# Patient Record
Sex: Female | Born: 1990 | Race: Black or African American | Hispanic: No | Marital: Single | Smoking: Never smoker
Health system: Southern US, Community
[De-identification: ages and names within clinical notes are randomized; demographics above are authoritative.]

## PROBLEM LIST (undated history)

## (undated) ENCOUNTER — Inpatient Hospital Stay (HOSPITAL_COMMUNITY): Payer: Self-pay

## (undated) DIAGNOSIS — Z789 Other specified health status: Secondary | ICD-10-CM

## (undated) HISTORY — PX: BREAST SURGERY: SHX581

---

## 2014-04-08 ENCOUNTER — Other Ambulatory Visit (HOSPITAL_COMMUNITY)
Admission: RE | Admit: 2014-04-08 | Discharge: 2014-04-08 | Disposition: A | Discharge status: Home / Self Care | Payer: BC Managed Care – PPO | Source: Ambulatory Visit | Attending: Obstetrics & Gynecology | Admitting: Obstetrics & Gynecology

## 2014-04-08 DIAGNOSIS — Z01419 Encounter for gynecological examination (general) (routine) without abnormal findings: Secondary | ICD-10-CM | POA: Insufficient documentation

## 2014-04-15 ENCOUNTER — Emergency Department (HOSPITAL_COMMUNITY)
Admission: EM | Admit: 2014-04-15 | Discharge: 2014-04-16 | Disposition: A | Discharge status: Home / Self Care | Payer: BC Managed Care – PPO | Attending: Emergency Medicine | Admitting: Emergency Medicine

## 2014-04-15 ENCOUNTER — Encounter (HOSPITAL_COMMUNITY): Payer: Self-pay | Admitting: Emergency Medicine

## 2014-04-15 DIAGNOSIS — O210 Mild hyperemesis gravidarum: Secondary | ICD-10-CM | POA: Insufficient documentation

## 2014-04-15 DIAGNOSIS — R1013 Epigastric pain: Secondary | ICD-10-CM | POA: Insufficient documentation

## 2014-04-15 DIAGNOSIS — Z792 Long term (current) use of antibiotics: Secondary | ICD-10-CM | POA: Insufficient documentation

## 2014-04-15 DIAGNOSIS — R197 Diarrhea, unspecified: Secondary | ICD-10-CM | POA: Insufficient documentation

## 2014-04-15 DIAGNOSIS — O9989 Other specified diseases and conditions complicating pregnancy, childbirth and the puerperium: Secondary | ICD-10-CM | POA: Insufficient documentation

## 2014-04-15 DIAGNOSIS — Z3202 Encounter for pregnancy test, result negative: Secondary | ICD-10-CM | POA: Insufficient documentation

## 2014-04-15 DIAGNOSIS — N898 Other specified noninflammatory disorders of vagina: Secondary | ICD-10-CM | POA: Insufficient documentation

## 2014-04-15 DIAGNOSIS — Z3490 Encounter for supervision of normal pregnancy, unspecified, unspecified trimester: Secondary | ICD-10-CM

## 2014-04-15 DIAGNOSIS — R112 Nausea with vomiting, unspecified: Secondary | ICD-10-CM

## 2014-04-15 LAB — CBC WITH DIFFERENTIAL/PLATELET
Basophils Absolute: 0 10*3/uL (ref 0.0–0.1)
Basophils Relative: 0 % (ref 0–1)
Eosinophils Absolute: 0 10*3/uL (ref 0.0–0.7)
Eosinophils Relative: 0 % (ref 0–5)
HEMATOCRIT: 38.7 % (ref 36.0–46.0)
Hemoglobin: 13.5 g/dL (ref 12.0–15.0)
LYMPHS ABS: 1.2 10*3/uL (ref 0.7–4.0)
Lymphocytes Relative: 16 % (ref 12–46)
MCH: 30.5 pg (ref 26.0–34.0)
MCHC: 34.9 g/dL (ref 30.0–36.0)
MCV: 87.6 fL (ref 78.0–100.0)
MONO ABS: 0.3 10*3/uL (ref 0.1–1.0)
MONOS PCT: 5 % (ref 3–12)
NEUTROS ABS: 6 10*3/uL (ref 1.7–7.7)
NEUTROS PCT: 79 % — AB (ref 43–77)
Platelets: 452 10*3/uL — ABNORMAL HIGH (ref 150–400)
RBC: 4.42 MIL/uL (ref 3.87–5.11)
RDW: 14.8 % (ref 11.5–15.5)
WBC: 7.6 10*3/uL (ref 4.0–10.5)

## 2014-04-15 LAB — COMPREHENSIVE METABOLIC PANEL
ALT: 16 U/L (ref 0–35)
AST: 23 U/L (ref 0–37)
Albumin: 4.4 g/dL (ref 3.5–5.2)
Alkaline Phosphatase: 49 U/L (ref 39–117)
BUN: 7 mg/dL (ref 6–23)
CO2: 20 meq/L (ref 19–32)
CREATININE: 0.59 mg/dL (ref 0.50–1.10)
Calcium: 9.7 mg/dL (ref 8.4–10.5)
Chloride: 100 mEq/L (ref 96–112)
GFR calc Af Amer: >90 mL/min (ref >90)
Glucose, Bld: 89 mg/dL (ref 70–99)
Potassium: 4.2 mEq/L (ref 3.7–5.3)
SODIUM: 135 meq/L — AB (ref 137–147)
TOTAL PROTEIN: 8.1 g/dL (ref 6.0–8.3)
Total Bilirubin: 0.3 mg/dL (ref 0.3–1.2)

## 2014-04-15 LAB — WET PREP, GENITAL
Trich, Wet Prep: NONE SEEN
YEAST WET PREP: NONE SEEN

## 2014-04-15 LAB — HCG, QUANTITATIVE, PREGNANCY: HCG, BETA CHAIN, QUANT, S: 54229 m[IU]/mL — AB (ref <5)

## 2014-04-15 LAB — LIPASE, BLOOD: LIPASE: 22 U/L (ref 11–59)

## 2014-04-15 LAB — URINE MICROSCOPIC-ADD ON

## 2014-04-15 LAB — URINALYSIS, ROUTINE W REFLEX MICROSCOPIC
Bilirubin Urine: NEGATIVE
GLUCOSE, UA: NEGATIVE mg/dL
Hgb urine dipstick: NEGATIVE
Ketones, ur: >80 mg/dL — AB
Nitrite: NEGATIVE
PH: 5.5 (ref 5.0–8.0)
Protein, ur: NEGATIVE mg/dL
Specific Gravity, Urine: 1.025 (ref 1.005–1.030)
Urobilinogen, UA: 0.2 mg/dL (ref 0.0–1.0)

## 2014-04-15 LAB — POC URINE PREG, ED: Preg Test, Ur: POSITIVE — AB

## 2014-04-15 LAB — ABO/RH: ABO/RH(D): O POS

## 2014-04-15 MED ORDER — SODIUM CHLORIDE 0.9 % IV BOLUS (SEPSIS)
1000.0000 mL | INTRAVENOUS | Status: AC
  Administered 2014-04-16: 1000 mL via INTRAVENOUS

## 2014-04-15 MED ORDER — ONDANSETRON 4 MG PO TBDP
8.0000 mg | ORAL_TABLET | Freq: Once | ORAL | Status: AC
  Administered 2014-04-15: 8 mg via ORAL
  Filled 2014-04-15: qty 2

## 2014-04-15 MED ORDER — GI COCKTAIL ~~LOC~~
30.0000 mL | Freq: Once | ORAL | Status: AC
  Administered 2014-04-15: 30 mL via ORAL
  Filled 2014-04-15: qty 30

## 2014-04-15 MED ORDER — THIAMINE HCL 100 MG/ML IJ SOLN
100.0000 mg | Freq: Once | INTRAMUSCULAR | Status: AC
  Administered 2014-04-15: 100 mg via INTRAVENOUS
  Filled 2014-04-15: qty 2

## 2014-04-15 MED ORDER — SODIUM CHLORIDE 0.9 % IV BOLUS (SEPSIS)
500.0000 mL | Freq: Once | INTRAVENOUS | Status: AC
  Administered 2014-04-15: 500 mL via INTRAVENOUS

## 2014-04-15 NOTE — ED Notes (Addendum)
Pt reports N/V x 4 days with diarrhea starting today. Reports 5/10 generalized abdominal pain. States she had positive preg test yesterday.  No vaginal discharge, bleeding. LMP 03/01/2014. NAD.

## 2014-04-15 NOTE — ED Provider Notes (Signed)
CSN: [id number]     Arrival date & time 04/15/14  1914 History   First MD Initiated Contact with Patient 04/15/14 2158     Chief Complaint  Patient presents with  . Emesis  . Abdominal Pain     (Consider location/radiation/quality/duration/timing/severity/associated sxs/prior Treatment) The history is provided by the patient and medical records. No language interpreter was used.    Autumn Payne is a 23 y.o. female  G2P0010 with no major medical problems presents to the Emergency Department complaining of gradual, persistent, progressively worsening nausea and vomiting  Onset 3 days ago. Pt reports emesis today at 3:30 and she reports several times each day.  Emesis is stomach contents and NBNB.  Associated symptoms include intermittent epigastric abd cramping which did not begin until today.  Pt denies lower abd or suprapubic abd cramping.   Pt reports she began with diarrhea today (2 loose stools, without melena or hematochezia).  No aggravating or alleviating factors.  Pt denies sick contacts  Pt denies fever, chills, headache, neck pain, chest pain, SOB, weakness, dizziness, syncope, vaginal discharge, vaginal bleeding.  LMP: March 9th.  Pt reports positive home pregnancy test last night.    History reviewed. No pertinent past medical history. Past Surgical History  Procedure Laterality Date  . Breast surgery      cyst removal   History reviewed. No pertinent family history. History  Substance Use Topics  . Smoking status: Never Smoker   . Smokeless tobacco: Not on file  . Alcohol Use: Yes     Comment: social   OB History   Grav Para Term Preterm Abortions TAB SAB Ect Mult Living   1    1     0     Review of Systems  Constitutional: Negative for fever, diaphoresis, appetite change, fatigue and unexpected weight change.  HENT: Negative for mouth sores.   Eyes: Negative for visual disturbance.  Respiratory: Negative for cough, chest tightness, shortness of breath and  wheezing.   Cardiovascular: Negative for chest pain.  Gastrointestinal: Positive for nausea, vomiting ( several episodes each day), abdominal pain (epigastric, soreness) and diarrhea (loose stool only x2). Negative for constipation.  Endocrine: Negative for polydipsia, polyphagia and polyuria.  Genitourinary: Negative for dysuria, urgency, frequency and hematuria.  Musculoskeletal: Negative for back pain and neck stiffness.  Skin: Negative for rash.  Allergic/Immunologic: Negative for immunocompromised state.  Neurological: Negative for syncope, light-headedness and headaches.  Hematological: Does not bruise/bleed easily.  Psychiatric/Behavioral: Negative for sleep disturbance. The patient is not nervous/anxious.       Allergies  Sulfa antibiotics  Home Medications   Prior to Admission medications   Medication Sig Start Date End Date Taking? Authorizing Provider  metroNIDAZOLE (FLAGYL) 500 MG tablet Take 500 mg by mouth 2 (two) times daily. x7 days   Yes Historical Provider, MD   BP 103/48  Pulse 79  Temp(Src) 97.4 F (36.3 C) (Oral)  Resp 18  Ht 5\' 3"  (1.6 m)  Wt 131 lb 8 oz (59.648 kg)  BMI 23.30 kg/m2  SpO2 100%  LMP 03/01/2014 Physical Exam  Nursing note and vitals reviewed. Constitutional: She is oriented to person, place, and time. She appears well-developed and well-nourished. No distress.  Awake, alert, nontoxic appearance  HENT:  Head: Normocephalic and atraumatic.  Mouth/Throat: Oropharynx is clear and moist. No oropharyngeal exudate.  Eyes: Conjunctivae are normal. No scleral icterus.  Neck: Normal range of motion. Neck supple.  Cardiovascular: Normal rate, regular rhythm, normal heart sounds and  intact distal pulses.   No murmur heard. RRR  Pulmonary/Chest: Effort normal and breath sounds normal. No respiratory distress. She has no wheezes.  Clear and equal breath sounds  Abdominal: Soft. Bowel sounds are normal. She exhibits no distension and no mass.  There is no tenderness. There is no rebound and no guarding. Hernia confirmed negative in the right inguinal area and confirmed negative in the left inguinal area.  abd flat, soft and nontender  Genitourinary: Uterus normal. There is no rash, tenderness, lesion or injury on the right labia. There is no rash, tenderness, lesion or injury on the left labia. Uterus is not deviated, not enlarged, not fixed and not tender. Cervix exhibits no motion tenderness, no discharge and no friability. Right adnexum displays no mass, no tenderness and no fullness. Left adnexum displays no mass, no tenderness and no fullness. No erythema, tenderness or bleeding around the vagina. No foreign body around the vagina. No signs of injury around the vagina. Vaginal discharge ( Scant, thick, white ) found.  Pelvic exam with NO tenderness Specifically no lateralizing tenderness, no fullness or tenderness to either adnexa; no palpable masses No cervical motion tenderness Cervical os closed No bleeding from the os or noted in the vaginal vault  Musculoskeletal: Normal range of motion. She exhibits no edema.  Lymphadenopathy:    She has no cervical adenopathy.       Right: No inguinal adenopathy present.       Left: No inguinal adenopathy present.  Neurological: She is alert and oriented to person, place, and time. She exhibits normal muscle tone. Coordination normal.  Speech is clear and goal oriented Moves extremities without ataxia  Skin: Skin is warm and dry. She is not diaphoretic. No erythema.  Psychiatric: She has a normal mood and affect. Her behavior is normal.    ED Course  Procedures (including critical care time) Labs Review Labs Reviewed  WET PREP, GENITAL - Abnormal; Notable for the following:    Clue Cells Wet Prep HPF POC FEW (*)    WBC, Wet Prep HPF POC FEW (*)    All other components within normal limits  COMPREHENSIVE METABOLIC PANEL - Abnormal; Notable for the following:    Sodium 135 (*)     All other components within normal limits  CBC WITH DIFFERENTIAL - Abnormal; Notable for the following:    Platelets 452 (*)    Neutrophils Relative % 79 (*)    All other components within normal limits  URINALYSIS, ROUTINE W REFLEX MICROSCOPIC - Abnormal; Notable for the following:    APPearance CLOUDY (*)    Ketones, ur >80 (*)    Leukocytes, UA SMALL (*)    All other components within normal limits  HCG, QUANTITATIVE, PREGNANCY - Abnormal; Notable for the following:    hCG, Beta Chain, Quant, S [id number] (*)    All other components within normal limits  URINE MICROSCOPIC-ADD ON - Abnormal; Notable for the following:    Squamous Epithelial / LPF MANY (*)    All other components within normal limits  POC URINE PREG, ED - Abnormal; Notable for the following:    Preg Test, Ur POSITIVE (*)    All other components within normal limits  GC/CHLAMYDIA PROBE AMP  LIPASE, BLOOD  ABO/RH    Imaging Review No results found.   EKG Interpretation None      MDM   Final diagnoses:  Nausea and vomiting  Pregnancy   Autumn Payne presents with nausea, intermittent vomiting and  only 2 episodes of loose stools.  Pt endorses epigastric abd pain that began today and is not located in the lower quadrants or suprapubic.  Pt without dysuria, vaginal bleeding.  Abdomen soft and nontender on exam. No rebound, peritoneal signs or guarding.  Patient without any tenderness on pelvic exam. Specifically no lateralizing tenderness. No palpable masses or fullness to either adnexa. Patient without cervical motion tenderness. Cervical os is closed there is no blood in the vaginal vault or coming from the cervical os.  Patient with benign abdomen, no tenderness, no rigidity, no guarding and no other concerning exam findings. Patient's epigastric discomfort likely secondary to her repetitive vomiting. I highly doubt ectopic pregnancy this time.  Patient given GI cocktail and Zofran. She is tolerating by mouth  here in the department without difficulty.  UA without evidence of urinary tract infection but with ketones. Patient given 2 L of fluid. Wet prep without concern for STD. Patient is O+ no need for RhoGAM.  All other labs unremarkable.  Discussed all these findings and my concerns with the patient. She reports that she will call tomorrow for OB followup. I recommend early next week. Patient is to return to the emergency department, specifically women's outpatient emergency department if she develops lower abdominal pain, vaginal bleeding or worsening symptoms.  It has been determined that no acute conditions requiring further emergency intervention are present at this time. The patient/guardian have been advised of the diagnosis and plan. We have discussed signs and symptoms that warrant return to the ED, such as changes or worsening in symptoms.   Vital signs are stable at discharge.   BP 103/48  Pulse 79  Temp(Src) 97.4 F (36.3 C) (Oral)  Resp 18  Ht 5\' 3"  (1.6 m)  Wt 131 lb 8 oz (59.648 kg)  BMI 23.30 kg/m2  SpO2 100%  LMP 03/01/2014  Patient/guardian has voiced understanding and agreed to follow-up with the PCP or specialist.      Dierdre Forth, PA-C 04/16/14 [phone]

## 2014-04-16 MED ORDER — ONDANSETRON 8 MG PO TBDP
ORAL_TABLET | ORAL | Status: DC
Start: 2014-04-16 — End: 2014-06-04

## 2014-04-16 NOTE — ED Provider Notes (Signed)
Medical screening examination/treatment/procedure(s) were performed by non-physician practitioner and as supervising physician I was immediately available for consultation/collaboration.   EKG Interpretation None       Calel Pisarski F Shlome Baldree, MD 04/16/14 1436 

## 2014-04-16 NOTE — Discharge Instructions (Signed)
1. Medications: zofran for vomiting that won't stop, usual home medications 2. Treatment: rest, drink plenty of fluids,  3. Follow Up: Please followup with your OB/GYN for discussion of your diagnoses and further evaluation after today's visit; return to the emergency department immediately if you develop lower abdominal pain, vaginal bleeding or worsening symptoms. In particular [facility] on her discharge instructions.    Cyclic Vomiting Syndrome Cyclic vomiting syndrome is a benign condition in which patients experience bouts or cycles of severe nausea and vomiting that last for hours or even days. The bouts of nausea and vomiting alternate with longer periods of no symptoms and generally good health. Cyclic vomiting syndrome occurs mostly in children, but can affect adults. CAUSES  [facility] has no known cause. Each episode is typically similar to the previous ones. The episodes tend to:   Start at about the same time of day.  Last the same length of time.  Present the same symptoms at the same level of intensity. Cyclic vomiting syndrome can begin at any age in children and adults. Cyclic vomiting syndrome usually starts between the ages of 3 and 7 years. In adults, episodes tend to occur less often than they do in children, but they last longer. Furthermore, the events or situations that trigger episodes in adults cannot always be pinpointed as easily as they can in children. There are 4 phases of cyclic vomiting syndrome: 1. Prodrome. The prodrome phase signals that an episode of nausea and vomiting is about to begin. This phase can last from just a few minutes to several hours. This phase is often marked by belly (abdominal) pain. Sometimes taking medicine early in the prodrome phase can stop an episode in progress. However, sometimes there is no warning. A person may simply wake up in the middle of the night or early morning and  begin vomiting. 2. Episode. The episode phase consists of:  Severe vomiting.  Nausea.  Gagging (retching). 3. Recovery. The recovery phase begins when the nausea and vomiting stop. Healthy color, appetite, and energy return. 4. Symptom-free interval. The symptom-free interval phase is the period between episodes when no symptoms are present. TRIGGERS Episodes can be triggered by an infection or event. Examples of triggers include:  Infections.  Colds, allergies, sinus problems, and the flu.  Eating certain foods such as chocolate or cheese.  Foods with monosodium glutamate (MSG) or preservatives.  Fast foods.  Pre-packaged foods.  Foods with low nutritional value (junk foods).  Overeating.  Eating just before going to bed.  Hot weather.  Dehydration.  Not enough sleep or poor sleep quality.  Physical exhaustion.  Menstruation.  Motion sickness.  Emotional stress (school or home difficulties).  Excitement or stress. SYMPTOMS  The main symptoms of cyclic vomiting syndrome are:  Severe vomiting.  Nausea.  Gagging (retching). Episodes usually begin at night or the first thing in the morning. Episodes may include vomiting or retching up to 5 or 6 times an hour during the worst of the episode. Episodes usually last anywhere from 1 to 4 days. Episodes can last for up to 10 days. Other symptoms include:  Paleness.  Exhaustion.  Listlessness.  Abdominal pain.  Loose stools or diarrhea. Sometimes the nausea and vomiting are so severe that a person appears to be almost unconscious. Sensitivity to light, headache, fever, dizziness, may also accompany an episode. In addition, the vomiting may cause drooling and excessive thirst. Drinking water usually leads  to more vomiting, though the water can dilute the acid in the vomit, making the episode a little less painful. Continuous vomiting can lead to dehydration, which means that the body has lost excessive water and  salts. DIAGNOSIS  Cyclic vomiting syndrome is hard to diagnose because there are no clear tests to identify it. A caregiver must diagnose cyclic vomiting syndrome by looking at symptoms and medical history. A caregiver must exclude more common diseases or disorders that can also cause nausea and vomiting. Also, diagnosis takes time because caregivers need to identify a pattern or cycle to the vomiting. TREATMENT  Cyclic vomiting syndrome cannot be cured. Treatment varies, but people with cyclic vomiting syndrome should get plenty of rest and sleep and take medications that prevent, stop, or lessen the vomiting episodes and other symptoms. People whose episodes are frequent and long-lasting may be treated during the symptom-free intervals in an effort to prevent or ease future episodes. The symptom-free phase is a good time to eliminate anything known to trigger an episode. For example, if episodes are brought on by stress or excitement, this period is the time to find ways to reduce stress and stay calm. If sinus problems or allergies cause episodes, those conditions should be treated. The triggers listed above should be avoided or prevented. Because of the similarities between migraine and cyclic vomiting syndrome, caregivers treat some people with severe cyclic vomiting syndrome with drugs that are also used for migraine headaches. The drugs are designed to:  Prevent episodes.  Reduce their frequency.  Lessen their severity. HOME CARE INSTRUCTIONS Once a vomiting episode begins, treatment is supportive. It helps to stay in bed and sleep in a dark, quiet room. Severe nausea and vomiting may require hospitalization and intravenous (IV) fluids to prevent dehydration. Relaxing medications (sedatives) may help if the nausea continues. Sometimes, during the prodrome phase, it is possible to stop an episode from happening altogether. Only take over-the-counter or prescription medicines for pain, discomfort  or fever as directed by your caregiver. Do not give aspirin to children. During the recovery phase, drinking water and replacing lost electrolytes (salts in the blood) are very important. Electrolytes are salts that the body needs to function well and stay healthy. Symptoms during the recovery phase can vary. Some people find that their appetites return to normal immediately, while others need to begin by drinking clear liquids and then move slowly to solid food. RELATED COMPLICATIONS The severe vomiting that defines cyclic vomiting syndrome is a risk factor for several complications:  Dehydration Vomiting causes the body to lose water quickly.  Electrolyte imbalance Vomiting also causes the body to lose the important salts it needs to keep working properly.  Peptic esophagitis The tube that connects the mouth to the stomach (esophagus) becomes injured from the stomach acid that comes up with the vomit.  Hematemesis The esophagus becomes irritated and bleeds, so blood mixes with the vomit.  Mallory-Weiss tear The lower end of the esophagus may tear open or the stomach may bruise from vomiting or retching.  Tooth decay The acid in the vomit can hurt the teeth by corroding the tooth enamel. SEEK MEDICAL CARE IF: You have questions or problems. Document Released: 02/18/2002 Document Revised: 03/03/2012 Document Reviewed: 03/19/2011 [facility] Patient Information 2014 [place], [place].

## 2014-04-17 LAB — GC/CHLAMYDIA PROBE AMP
CT Probe RNA: NEGATIVE
GC Probe RNA: NEGATIVE

## 2014-05-21 ENCOUNTER — Other Ambulatory Visit (HOSPITAL_COMMUNITY): Payer: Self-pay | Admitting: Nurse Practitioner

## 2014-05-21 DIAGNOSIS — Z3682 Encounter for antenatal screening for nuchal translucency: Secondary | ICD-10-CM

## 2014-06-04 ENCOUNTER — Ambulatory Visit (HOSPITAL_COMMUNITY)
Admission: RE | Admit: 2014-06-04 | Discharge: 2014-06-04 | Disposition: A | Discharge status: Home / Self Care | Payer: BC Managed Care – PPO | Source: Ambulatory Visit | Attending: Nurse Practitioner | Admitting: Nurse Practitioner

## 2014-06-04 ENCOUNTER — Encounter (HOSPITAL_COMMUNITY): Payer: Self-pay

## 2014-06-04 ENCOUNTER — Other Ambulatory Visit (HOSPITAL_COMMUNITY): Payer: Self-pay | Admitting: Nurse Practitioner

## 2014-06-04 ENCOUNTER — Encounter (HOSPITAL_COMMUNITY): Payer: Self-pay | Admitting: *Deleted

## 2014-06-04 ENCOUNTER — Ambulatory Visit (HOSPITAL_COMMUNITY): Admission: RE | Admit: 2014-06-04 | Payer: BC Managed Care – PPO | Source: Ambulatory Visit

## 2014-06-04 ENCOUNTER — Inpatient Hospital Stay (HOSPITAL_COMMUNITY)
Admission: AD | Admit: 2014-06-04 | Discharge: 2014-06-04 | Disposition: A | Discharge status: Home / Self Care | Payer: BC Managed Care – PPO | Source: Ambulatory Visit | Attending: Obstetrics & Gynecology | Admitting: Obstetrics & Gynecology

## 2014-06-04 DIAGNOSIS — Z3682 Encounter for antenatal screening for nuchal translucency: Secondary | ICD-10-CM

## 2014-06-04 DIAGNOSIS — N390 Urinary tract infection, site not specified: Secondary | ICD-10-CM

## 2014-06-04 DIAGNOSIS — R300 Dysuria: Secondary | ICD-10-CM | POA: Diagnosis present

## 2014-06-04 DIAGNOSIS — O2390 Unspecified genitourinary tract infection in pregnancy, unspecified trimester: Secondary | ICD-10-CM | POA: Insufficient documentation

## 2014-06-04 DIAGNOSIS — O2342 Unspecified infection of urinary tract in pregnancy, second trimester: Secondary | ICD-10-CM

## 2014-06-04 DIAGNOSIS — Z36 Encounter for antenatal screening of mother: Secondary | ICD-10-CM | POA: Diagnosis present

## 2014-06-04 HISTORY — DX: Other specified health status: Z78.9

## 2014-06-04 LAB — URINALYSIS, ROUTINE W REFLEX MICROSCOPIC
Bilirubin Urine: NEGATIVE
Glucose, UA: NEGATIVE mg/dL
HGB URINE DIPSTICK: NEGATIVE
Ketones, ur: 15 mg/dL — AB
NITRITE: NEGATIVE
PROTEIN: NEGATIVE mg/dL
Specific Gravity, Urine: 1.015 (ref 1.005–1.030)
Urobilinogen, UA: 0.2 mg/dL (ref 0.0–1.0)
pH: 6.5 (ref 5.0–8.0)

## 2014-06-04 LAB — URINE MICROSCOPIC-ADD ON

## 2014-06-04 MED ORDER — NITROFURANTOIN MONOHYD MACRO 100 MG PO CAPS: 100.0000 mg | ORAL_CAPSULE | Freq: Once | ORAL | Status: DC

## 2014-06-04 NOTE — MAU Note (Addendum)
Pt states she is having a lot of pain with urination and increase in frequency.  Denies vaginal bleeding or abnormal vaginal discharge.  Pain started yesterday and continues to get worse.  Pt was seen in [facility] today.  Had called [facility] and was advised to come to [facility]U after that appt. today

## 2014-06-04 NOTE — MAU Provider Note (Signed)
Attestation of Attending Supervision of Advanced Practitioner (PA/CNM/NP): Evaluation and management procedures were performed by the Advanced Practitioner under my supervision and collaboration.  I have reviewed the Advanced Practitioner's note and chart, and I agree with the management and plan.  Austin Pongratz, MD, FACOG Attending Obstetrician & Gynecologist Faculty Practice, [facility]  

## 2014-06-04 NOTE — Discharge Instructions (Signed)
Urinary Tract Infection A urinary tract infection (UTI) can occur any place along the urinary tract. The tract includes the kidneys, ureters, bladder, and urethra. A type of germ called bacteria often causes a UTI. UTIs are often helped with antibiotic medicine.  HOME CARE   If given, take antibiotics as told by your doctor. Finish them even if you start to feel better.  Drink enough fluids to keep your pee (urine) clear or pale yellow.  Avoid tea, drinks with caffeine, and bubbly (carbonated) drinks.  Pee often. Avoid holding your pee in for a long time.  Pee before and after having sex (intercourse).  Wipe from front to back after you poop (bowel movement) if you are a woman. Use each tissue only once. GET HELP RIGHT AWAY IF:   You have back pain.  You have lower belly (abdominal) pain.  You have chills.  You feel sick to your stomach (nauseous).  You throw up (vomit).  Your burning or discomfort with peeing does not go away.  You have a fever.  Your symptoms are not better in 3 days. MAKE SURE YOU:   Understand these instructions.  Will watch your condition.  Will get help right away if you are not doing well or get worse. Document Released: 05/28/2008 Document Revised: 09/03/2012 Document Reviewed: 07/10/2012 [facility]Exit[facility] Patient Information 2014 [place]ExitCare, [place].  Strongly encouraged patient to drink 6-8 glass of water a day to stay well hydrated and prevent UTI

## 2014-06-04 NOTE — MAU Provider Note (Signed)
History     CSN: [id number]  Arrival date and time: 06/04/14 1412   First Provider Initiated Contact with Patient 06/04/14 1449    Chief complaint: Pain with urination   HPI Autumn Payne is a 23 y/o G2P0010 black female presents to the MAU for pain with urination since yesterday. Pt endorses increased frequency, urgency, and burning with urination but denies hematuria and flank pain. Pt does not have fevers, chills, N/V. Pt is 1777w4d pregnant dated by LMP jon 03/01/14. Pt states that she has had a UTI in the past and that these symptoms feel exactly the same. Pt denies vaginal bleeding, itching and odor. Pt also endorsed two dizzy spells last week but admitted to drinking only 1/2 bottle of water, daily. Dizzy spells have resolved.   [facility]   Past Medical History  Diagnosis Date  . Medical history non-contributory     Past Surgical History  Procedure Laterality Date  . Breast surgery      cyst removal    History reviewed. No pertinent family history.  History  Substance Use Topics  . Smoking status: Never Smoker   . Smokeless tobacco: Not on file  . Alcohol Use: Yes     Comment: social    Allergies:  Allergies  Allergen Reactions  . Sulfa Antibiotics Hives    Prescriptions prior to admission  Medication Sig Dispense Refill  . Loratadine (CLARITIN PO) Take by mouth.      . metroNIDAZOLE (FLAGYL) 500 MG tablet Take 500 mg by mouth 2 (two) times daily. x7 days      . ondansetron (ZOFRAN ODT) 8 MG disintegrating tablet 8mg  ODT q4 hours prn nausea  10 tablet  0  . Prenatal Multivit-Min-Fe-FA (PRENATAL VITAMINS PO) Take by mouth.        Review of Systems  Constitutional: Negative for fever and chills.  Eyes: Negative for blurred vision.  Respiratory: Negative for shortness of breath.   Cardiovascular: Negative for chest pain.  Gastrointestinal: Negative for nausea, vomiting and abdominal pain.  Genitourinary: Positive for  dysuria, urgency and frequency. Negative for hematuria and flank pain.  Musculoskeletal: Negative for back pain.  Neurological: Positive for dizziness (two dizzy spells last week). Negative for headaches.   Physical Exam   Blood pressure 110/62, pulse 78, temperature 98.6 F (37 C), temperature source Oral, resp. rate 18, height 5' 1.5" (1.562 m), weight 58.06 kg (128 lb), last menstrual period 03/01/2014, SpO2 100.00%.  Physical Exam  Constitutional: She is oriented to person, place, and time. She appears well-developed and well-nourished. No distress.  Cardiovascular: Normal rate, regular rhythm and normal heart sounds.   Respiratory: Effort normal and breath sounds normal.  GI: Soft. Bowel sounds are normal. There is no tenderness. There is no rebound, no guarding and no CVA tenderness.  Genitourinary: Uterus is enlarged (Fundus palpated just above pubic symphysis ). Uterus is not tender.  Neurological: She is alert and oriented to person, place, and time.  Psychiatric: She has a normal mood and affect. Her behavior is normal.   Fetal HR by Doppler: 154 bpm   Results for orders placed during the hospital encounter of 06/04/14 (from the past 24 hour(s))  URINALYSIS, ROUTINE W REFLEX MICROSCOPIC     Status: Abnormal   Collection Time    06/04/14  2:30 PM      Result Value Ref Range   Color, Urine YELLOW  YELLOW   APPearance CLEAR  CLEAR   Specific Gravity, Urine  1.015  1.005 - 1.030   pH 6.5  5.0 - 8.0   Glucose, UA NEGATIVE  NEGATIVE mg/dL   Hgb urine dipstick NEGATIVE  NEGATIVE   Bilirubin Urine NEGATIVE  NEGATIVE   Ketones, ur 15 (*) NEGATIVE mg/dL   Protein, ur NEGATIVE  NEGATIVE mg/dL   Urobilinogen, UA 0.2  0.0 - 1.0 mg/dL   Nitrite NEGATIVE  NEGATIVE   Leukocytes, UA MODERATE (*) NEGATIVE  URINE MICROSCOPIC-ADD ON     Status: Abnormal   Collection Time    06/04/14  2:30 PM      Result Value Ref Range   Squamous Epithelial / LPF FEW (*) RARE   WBC, UA 11-20  <3  WBC/hpf   Bacteria, UA RARE  RARE    MAU Course  Procedures  Urine culture pending  None  Assessment and Plan  A:  Acute UTI      13w 4d gestation  P:  Treat with Macrobid 1 tab po bid X 1 week--take all medication Educate patient about the infection, causes, and preventive measures Advise the patient to increase fluid intake for proper hydration  Bing PlumeGervasi, Kristin E 06/04/2014, 3:03 PM   I have reviewed the student's HPI, labs and plan of care and agree with findings Rx did not go through EPIC.  I called the Rx for Macrobid 1 po bid X 1week #14 [facility] [place]

## 2014-06-04 NOTE — Progress Notes (Signed)
[facility] ultrasound  Indication: 23 yr old G2P0010 at 1642w4d for first trimester screen.  Findings: 1. Single intrauterine pregnancy. 2. Fetal crown rump length is consistent with dating. 3. Normal uterus; no adnexal masses seen. 4. Evaluation of fetal anatomy is limited by early gestational age. 5. Nuchal translucency could not be obtained secondary to fetal position. 6. The nasal bone is indeterminate.   Recommendations: 1. Appropriate dating. 2. Could not obtain nuchal translucency: - offered patient reattempt or quad screen - opted for reattempt and will return 06/07/14 - discussed limitations of screening tests in detecting fetal aneuploidy 3. Recommend maternal serum AFP at 15-20 weeks 4. Recommend fetal anatomic survey at 18-20 weeks  Eulis FosterKristen Ovella Manygoats, MD

## 2014-06-07 ENCOUNTER — Encounter (HOSPITAL_COMMUNITY): Payer: Self-pay

## 2014-06-07 ENCOUNTER — Ambulatory Visit (HOSPITAL_COMMUNITY)
Admission: RE | Admit: 2014-06-07 | Discharge: 2014-06-07 | Disposition: A | Discharge status: Home / Self Care | Payer: BC Managed Care – PPO | Source: Ambulatory Visit | Attending: Nurse Practitioner | Admitting: Nurse Practitioner

## 2014-06-07 ENCOUNTER — Other Ambulatory Visit (HOSPITAL_COMMUNITY): Payer: Self-pay | Admitting: Nurse Practitioner

## 2014-06-07 DIAGNOSIS — Z36 Encounter for antenatal screening of mother: Secondary | ICD-10-CM | POA: Diagnosis present

## 2014-06-07 DIAGNOSIS — Z3682 Encounter for antenatal screening for nuchal translucency: Secondary | ICD-10-CM

## 2014-06-08 LAB — URINE CULTURE: SPECIAL REQUESTS: NORMAL

## 2014-06-09 ENCOUNTER — Telehealth: Payer: Self-pay | Admitting: Medical

## 2014-06-09 DIAGNOSIS — N390 Urinary tract infection, site not specified: Secondary | ICD-10-CM

## 2014-06-09 MED ORDER — CEPHALEXIN 500 MG PO CAPS: 500.0000 mg | ORAL_CAPSULE | Freq: Four times a day (QID) | ORAL | Status: DC

## 2014-06-09 NOTE — Telephone Encounter (Signed)
LM for patient to return call to MAU. Patient had urine culture resistant to Macrobid. Rx sent for Keflex. Patient needs to be notified to stop Macrobid and take Keflex instead.   Freddi StarrJulie N Ethier, PA-C 06/09/2014 9:55 AM

## 2014-06-09 NOTE — Telephone Encounter (Signed)
Patient returned call to MAU. Informed of change in antibiotic therapy. Patient voiced understanding.   Freddi StarrJulie N Ethier, PA-C 06/09/2014 11:12 AM

## 2014-06-14 ENCOUNTER — Other Ambulatory Visit: Payer: Self-pay

## 2014-06-23 ENCOUNTER — Other Ambulatory Visit (HOSPITAL_COMMUNITY): Payer: Self-pay | Admitting: Family

## 2014-06-23 DIAGNOSIS — Z3689 Encounter for other specified antenatal screening: Secondary | ICD-10-CM

## 2014-07-20 ENCOUNTER — Inpatient Hospital Stay (HOSPITAL_COMMUNITY)
Admission: AD | Admit: 2014-07-20 | Discharge: 2014-07-20 | Disposition: A | Discharge status: Home / Self Care | Payer: BC Managed Care – PPO | Source: Ambulatory Visit | Attending: Obstetrics & Gynecology | Admitting: Obstetrics & Gynecology

## 2014-07-20 ENCOUNTER — Ambulatory Visit (HOSPITAL_COMMUNITY)
Admission: RE | Admit: 2014-07-20 | Discharge: 2014-07-20 | Disposition: A | Discharge status: Home / Self Care | Payer: BC Managed Care – PPO | Source: Ambulatory Visit | Attending: Family | Admitting: Family

## 2014-07-20 ENCOUNTER — Encounter (HOSPITAL_COMMUNITY): Payer: Self-pay | Admitting: *Deleted

## 2014-07-20 DIAGNOSIS — O2390 Unspecified genitourinary tract infection in pregnancy, unspecified trimester: Secondary | ICD-10-CM | POA: Diagnosis not present

## 2014-07-20 DIAGNOSIS — Z3689 Encounter for other specified antenatal screening: Secondary | ICD-10-CM | POA: Insufficient documentation

## 2014-07-20 DIAGNOSIS — B9689 Other specified bacterial agents as the cause of diseases classified elsewhere: Secondary | ICD-10-CM

## 2014-07-20 DIAGNOSIS — N760 Acute vaginitis: Secondary | ICD-10-CM

## 2014-07-20 DIAGNOSIS — N898 Other specified noninflammatory disorders of vagina: Secondary | ICD-10-CM | POA: Diagnosis present

## 2014-07-20 DIAGNOSIS — A499 Bacterial infection, unspecified: Secondary | ICD-10-CM | POA: Diagnosis not present

## 2014-07-20 LAB — WET PREP, GENITAL
Trich, Wet Prep: NONE SEEN
Yeast Wet Prep HPF POC: NONE SEEN

## 2014-07-20 MED ORDER — METRONIDAZOLE 500 MG PO TABS: 500.0000 mg | ORAL_TABLET | Freq: Two times a day (BID) | ORAL | Status: AC

## 2014-07-20 NOTE — MAU Provider Note (Signed)
None     Chief Complaint:  Vaginal Discharge   Autumn Payne is  23 y.o. G2P0010 at 3886w1d presents complaining of Vaginal Discharge .  She states none contractions are associated with none vaginal bleeding, intact membranes, along with active fetal movement. Pt. With hx of bacterial vaginosis several weeks ago. S/p treatment with flagyl. She says that her symptoms have returned, including vaginal itching. She denies fever, chills, VB, LOF. She has +FM. She has no other complaints, but would like to be treated again.   Obstetrical/Gynecological History: OB History   Grav Para Term Preterm Abortions TAB SAB Ect Mult Living   2    1     0     Past Medical History: Past Medical History  Diagnosis Date  . Medical history non-contributory     Past Surgical History: Past Surgical History  Procedure Laterality Date  . Breast surgery      cyst removal    Family History: Family History  Problem Relation Age of Onset  . Hypertension Mother   . Hypertension Maternal Grandmother     Social History: History  Substance Use Topics  . Smoking status: Never Smoker   . Smokeless tobacco: Not on file  . Alcohol Use: Yes     Comment: social - not with pregnancy    Allergies:  Allergies  Allergen Reactions  . Sulfa Antibiotics Hives    Meds:  Prescriptions prior to admission  Medication Sig Dispense Refill  . loratadine (CLARITIN) 10 MG tablet Take 10 mg by mouth daily.      . Prenatal Vit-Fe Fumarate-FA (PRENATAL MULTIVITAMIN) TABS tablet Take 1 tablet by mouth daily at 12 noon.        Review of Systems -  Per HPI      Physical Exam  Blood pressure 102/66, pulse 82, temperature 99.2 F (37.3 C), temperature source Oral, resp. rate 16, height 5\' 2"  (1.575 m), weight 60.782 kg (134 lb), last menstrual period 03/01/2014, SpO2 100.00%. GENERAL: Well-developed, well-nourished female in no acute distress.  LUNGS: Clear to auscultation bilaterally.  HEART: Regular rate  and rhythm. ABDOMEN: Soft, nontender, nondistended, gravid.  EXTREMITIES: Nontender, no edema, 2+ distal pulses. Grossly neurologically intact CERVICAL EXAM: Deferred  FHT 150 by doppler  Labs: Results for orders placed during the hospital encounter of 07/20/14 (from the past 24 hour(s))  WET PREP, GENITAL   Collection Time    07/20/14  3:00 PM      Result Value Ref Range   Yeast Wet Prep HPF POC NONE SEEN  NONE SEEN   Trich, Wet Prep NONE SEEN  NONE SEEN   Clue Cells Wet Prep HPF POC FEW (*) NONE SEEN   WBC, Wet Prep HPF POC FEW (*) NONE SEEN     Assessment: Autumn Payne is  23 y.o. G2P0010 at 1286w1d presents with Vaginal discharge  Plan: - Likely recurrence of bacterial vaginosis - Lifestyle modifications reviewed.  - Flagyl 500mg  BID x 7 days.  - Follow up PRN.   Melancon, Hillery HunterCaleb G 7/28/20154:20 PM  I have seen this patient and agree with the above resident's note.  LEFTWICH-KIRBY, Luvia Orzechowski Certified Nurse-Midwife

## 2014-07-20 NOTE — Discharge Instructions (Signed)
Bacterial Vaginosis Bacterial vaginosis is a vaginal infection that occurs when the normal balance of bacteria in the vagina is disrupted. It results from an overgrowth of certain bacteria. This is the most common vaginal infection in women of childbearing age. Treatment is important to prevent complications, especially in pregnant women, as it can cause a premature delivery. CAUSES  Bacterial vaginosis is caused by an increase in harmful bacteria that are normally present in smaller amounts in the vagina. Several different kinds of bacteria can cause bacterial vaginosis. However, the reason that the condition develops is not fully understood. RISK FACTORS Certain activities or behaviors can put you at an increased risk of developing bacterial vaginosis, including:  Having a new sex partner or multiple sex partners.  Douching.  Using an intrauterine device (IUD) for contraception. Women do not get bacterial vaginosis from toilet seats, bedding, swimming pools, or contact with objects around them. SIGNS AND SYMPTOMS  Some women with bacterial vaginosis have no signs or symptoms. Common symptoms include:  Grey vaginal discharge.  A fishlike odor with discharge, especially after sexual intercourse.  Itching or burning of the vagina and vulva.  Burning or pain with urination. DIAGNOSIS  Your health care provider will take a medical history and examine the vagina for signs of bacterial vaginosis. A sample of vaginal fluid may be taken. Your health care provider will look at this sample under a microscope to check for bacteria and abnormal cells. A vaginal pH test may also be done.  TREATMENT  Bacterial vaginosis may be treated with antibiotic medicines. These may be given in the form of a pill or a vaginal cream. A second round of antibiotics may be prescribed if the condition comes back after treatment.  HOME CARE INSTRUCTIONS   Only take over-the-counter or prescription medicines as  directed by your health care provider.  If antibiotic medicine was prescribed, take it as directed. Make sure you finish it even if you start to feel better.  Do not have sex until treatment is completed.  Tell all sexual partners that you have a vaginal infection. They should see their health care provider and be treated if they have problems, such as a mild rash or itching.  Practice safe sex by using condoms and only having one sex partner. SEEK MEDICAL CARE IF:   Your symptoms are not improving after 3 days of treatment.  You have increased discharge or pain.  You have a fever. MAKE SURE YOU:   Understand these instructions.  Will watch your condition.  Will get help right away if you are not doing well or get worse. FOR MORE INFORMATION  Centers for Disease Control and Prevention, Division of STD Prevention: www.cdc.gov/std American Sexual Health Association (ASHA): www.ashastd.org  Document Released: 12/10/2005 Document Revised: 09/30/2013 Document Reviewed: 07/22/2013 Exit[employer] Patient Information 2015 [employer]. This information is not intended to replace advice given to you by your health care provider. Make sure you discuss any questions you have with your health care provider.  

## 2014-07-20 NOTE — MAU Note (Signed)
Pt had scheduled appt in u/s for anatomy scan today. Will go to u/s for appt and then return to mau for lab results and f/u plan

## 2014-07-20 NOTE — MAU Note (Signed)
Urine in lab 

## 2014-07-20 NOTE — Progress Notes (Signed)
Dr Arnell SievingMelanchon on unit and aware of pt's admission and status.

## 2014-07-20 NOTE — MAU Note (Signed)
Patient states that she was recently treated for BV and yeast. Took all the medication. Feels like the infections is back, has a vaginal odor with yellowish discharge. Denies pain, bleeding, leaking or abdominal pain. Has felt fetal movement.

## 2014-07-20 NOTE — MAU Note (Signed)
Pt back from her scheduled u/s. Sharen CounterLisa Leftwich-Kirby CNM in to discuss wet prep results with pt and d/c plan

## 2014-07-21 LAB — GC/CHLAMYDIA PROBE AMP
CT PROBE, AMP APTIMA: NEGATIVE
GC Probe RNA: NEGATIVE

## 2014-07-27 NOTE — MAU Provider Note (Signed)
Attestation of Attending Supervision of Advanced Practitioner (CNM/NP): Evaluation and management procedures were performed by the Advanced Practitioner under my supervision and collaboration. I have reviewed the Advanced Practitioner's note and chart, and I agree with the management and plan.  Janson Lamar H. 1:32 PM   

## 2014-10-25 ENCOUNTER — Encounter (HOSPITAL_COMMUNITY): Payer: Self-pay | Admitting: *Deleted

## 2015-04-09 ENCOUNTER — Encounter (HOSPITAL_COMMUNITY): Payer: Self-pay | Admitting: *Deleted

## 2016-04-28 IMAGING — US US MFM FETAL NUCHAL TRANSLUCENCY
1 series · 13 of 22 positions shown · non-contrast
Comparison: none

[Series 1: us mfm fetal nuchal translucency · 0.09mm/px · 22 acquisitions, 13 frames shown]
[im 1/22]
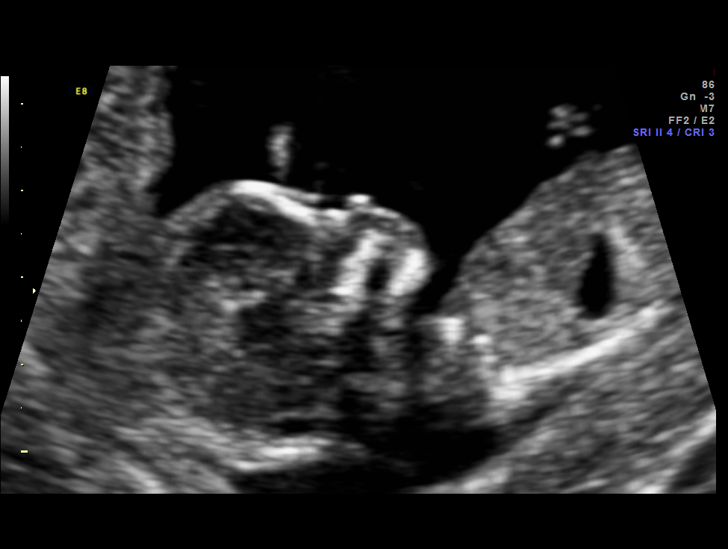
[im 3/22]
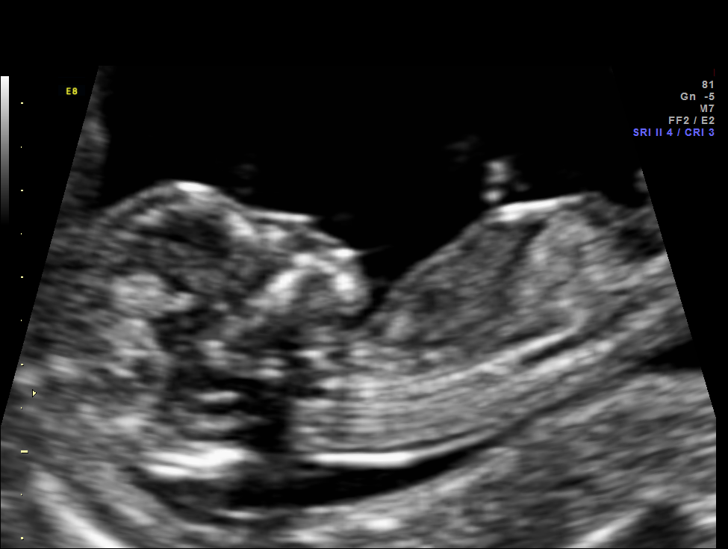
[im 5/22]
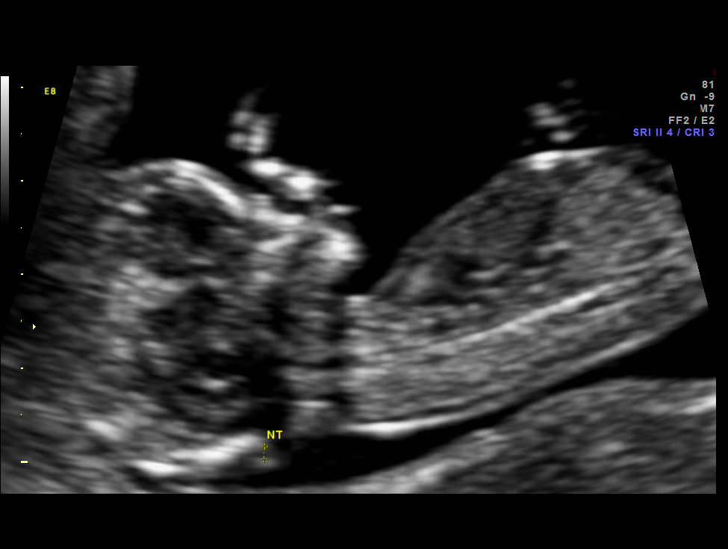
[im 6/22]
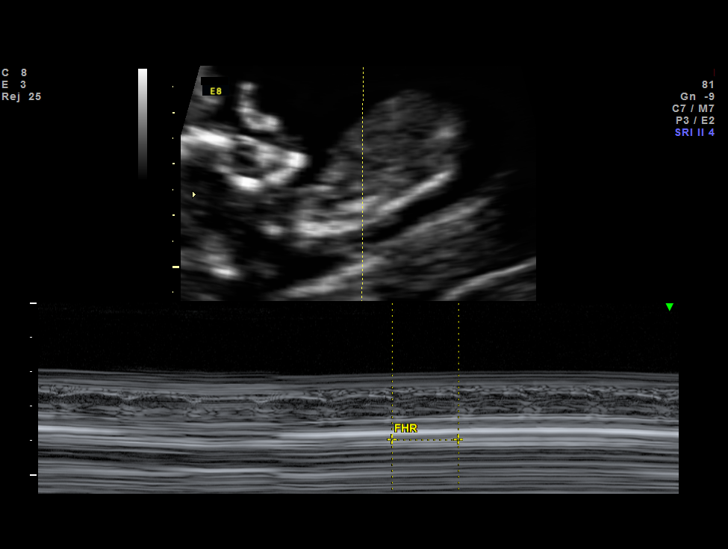
[im 8/22]
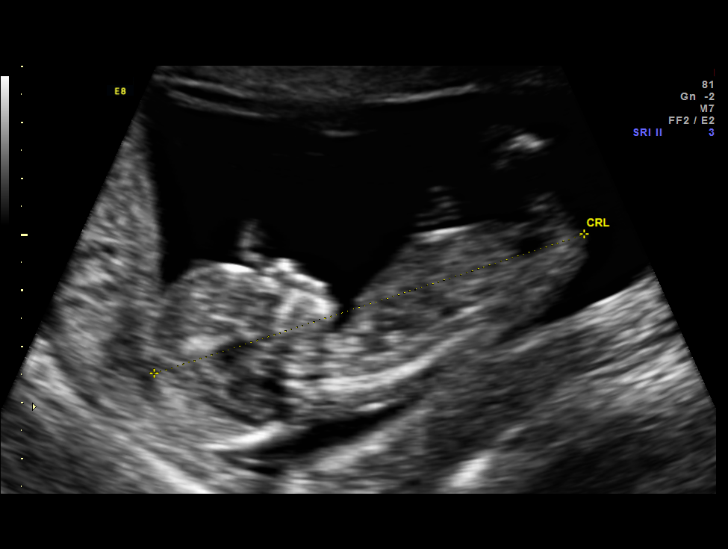
[im 10/22]
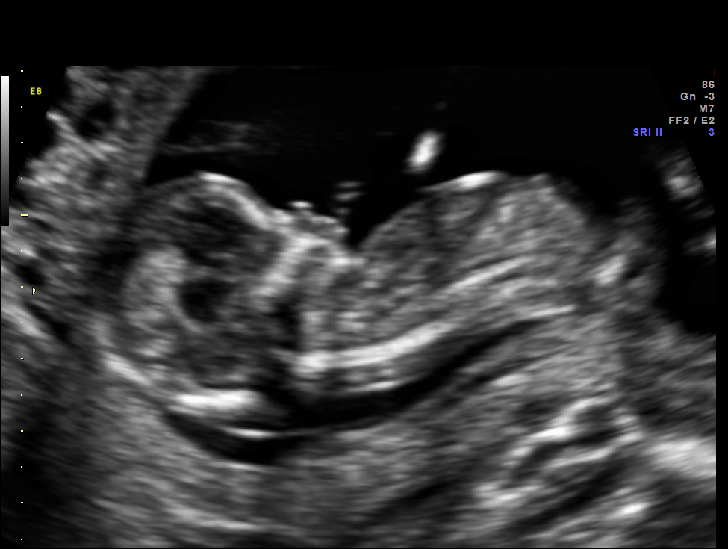
[im 12/22]
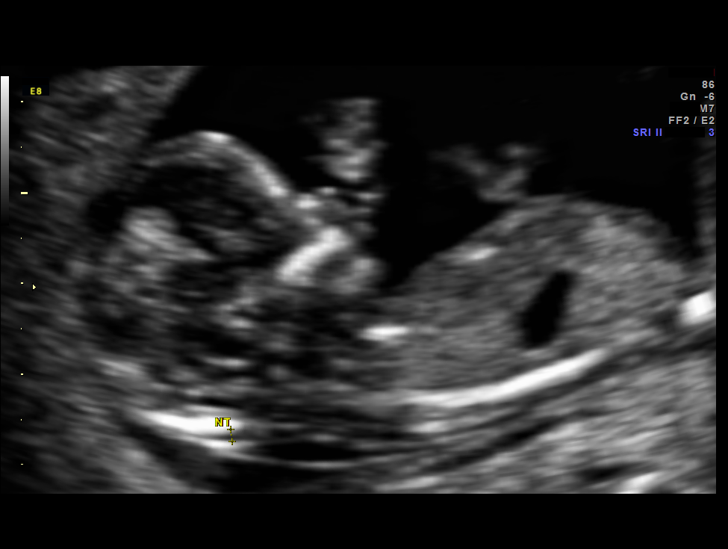
[im 13/22]
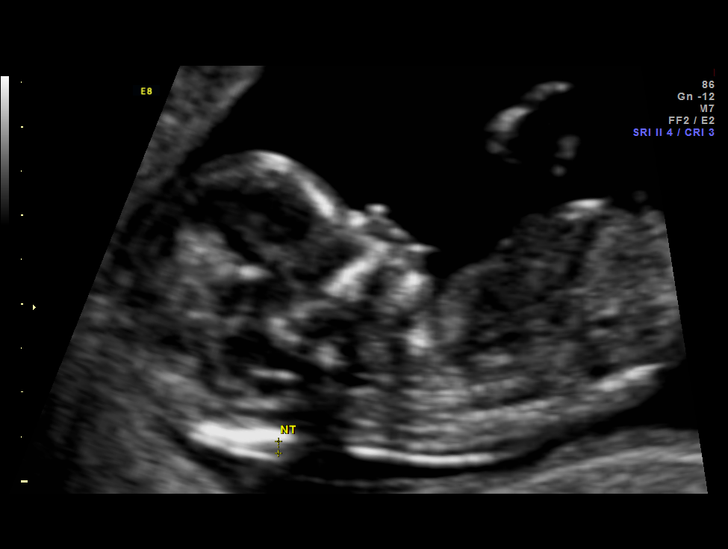
[im 15/22]
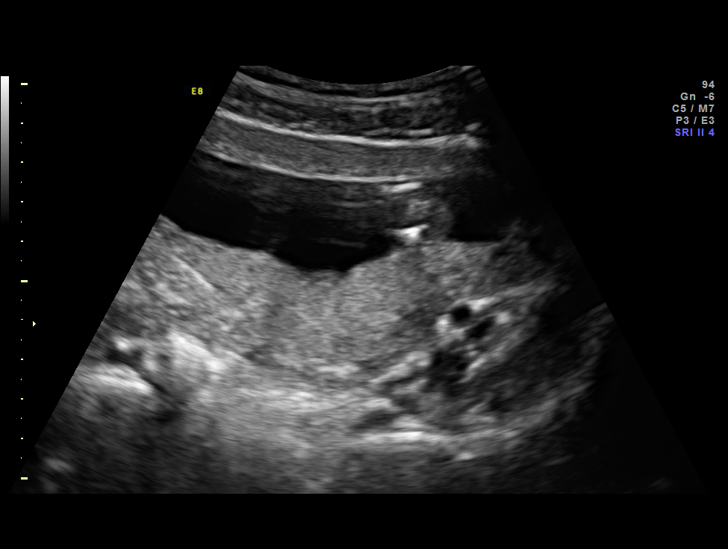
[im 17/22]
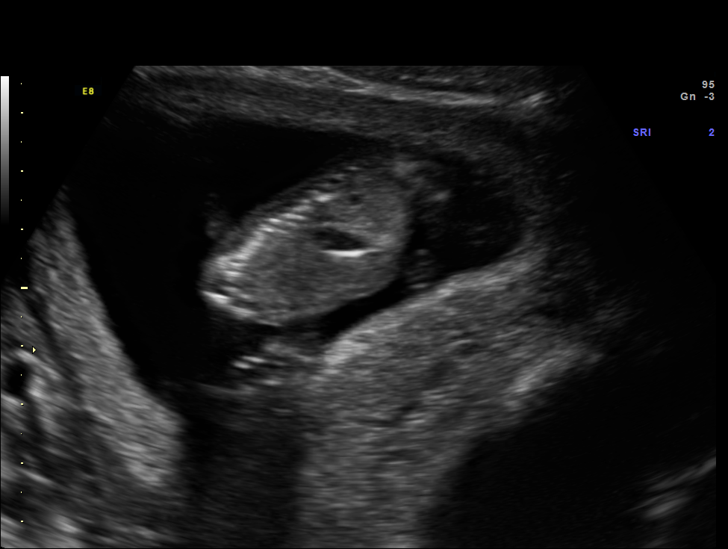
[im 18/22]
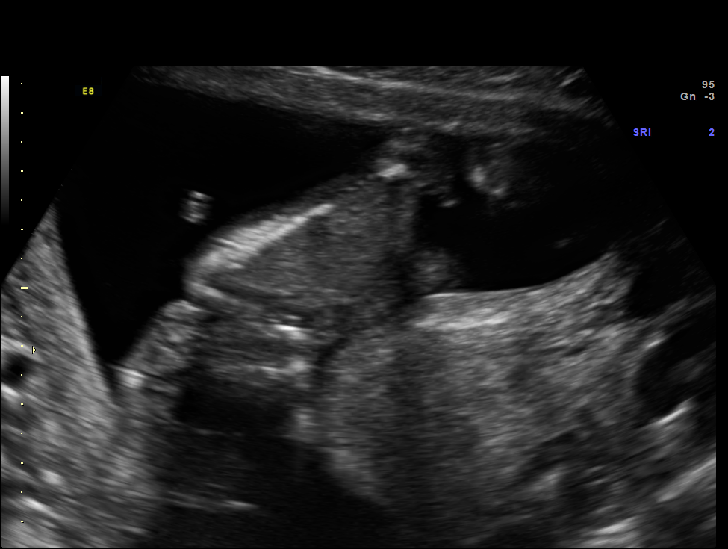
[im 20/22]
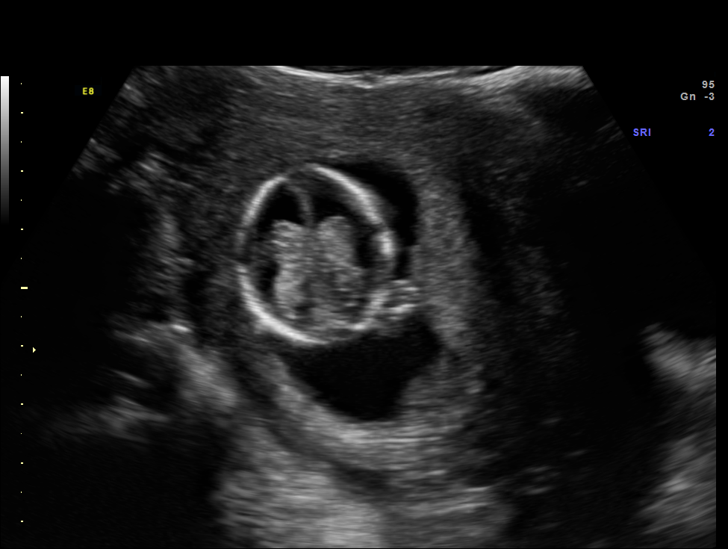
[im 22/22]
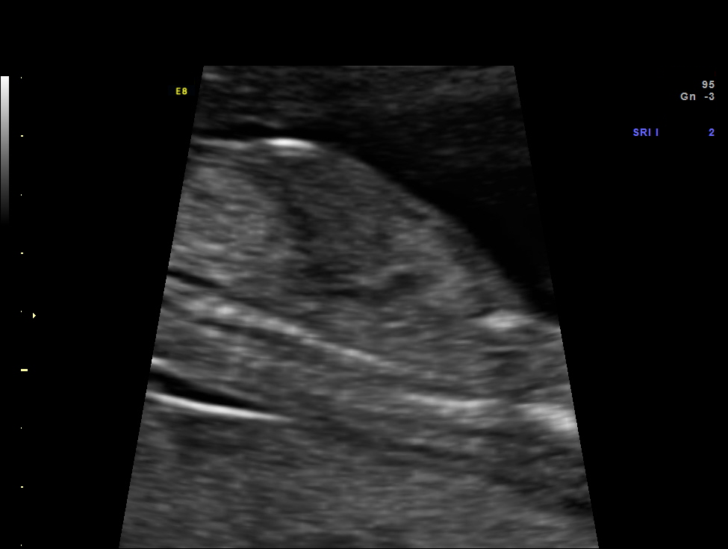

[13 of 22 positions shown; findings below may reference images not displayed]

OBSTETRICS REPORT
                      (Signed Final 06/07/2014 [DATE])

Service(s) Provided

Indications

 First trimester aneuploidy screen (NT)
Fetal Evaluation

 Num Of Fetuses:    1
 Fetal Heart Rate:  152                          bpm
 Cardiac Activity:  Observed
 Presentation:      Transverse, head to
                    maternal right
 Placenta:          Posterior

 Amniotic Fluid
 AFI FV:      Subjectively within normal limits
Gestational Age

 LMP:           14w 0d        Date:  03/01/14                 EDD:   12/06/14
 Best:          14w 0d     Det. By:  LMP  (03/01/14)          EDD:   12/06/14
1st Trimester Genetic Sonogram Screening

 CRL:            80.9  mm    G. Age:   13w 5d                 EDD:   12/08/14
 Nuc Trans:       1.5  mm

 Nasal Bone:                 Present
Cervix Uterus Adnexa

 Cervix:       Normal appearance by transabdominal scan.
Impression

 SIUP at 14+0 weeks
 No gross abnormalities identified
 NT measurement was within normal limits for this GA; NB
 present
 Normal amniotic fluid volume
 Measurements consistent with LMP dating
Recommendations

 Offer MSAFP in the second trimester for ONTD screening
 Offer anatomy U/S by 18 weeks

## 2016-06-10 IMAGING — US US OB COMP +14 WK
1 series · 12 of 28 positions shown · non-contrast
Comparison: none

[Series 1: us ob comp +14 wk · 12 of 118 slices shown]
[im 5/118]
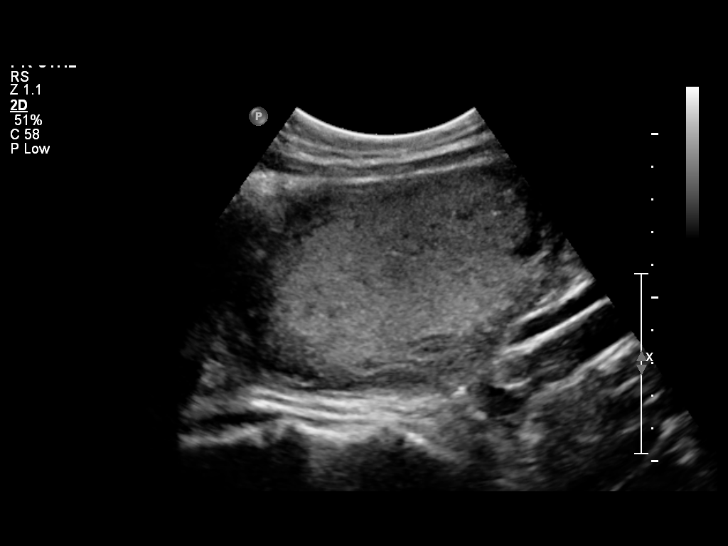
[im 14/118]
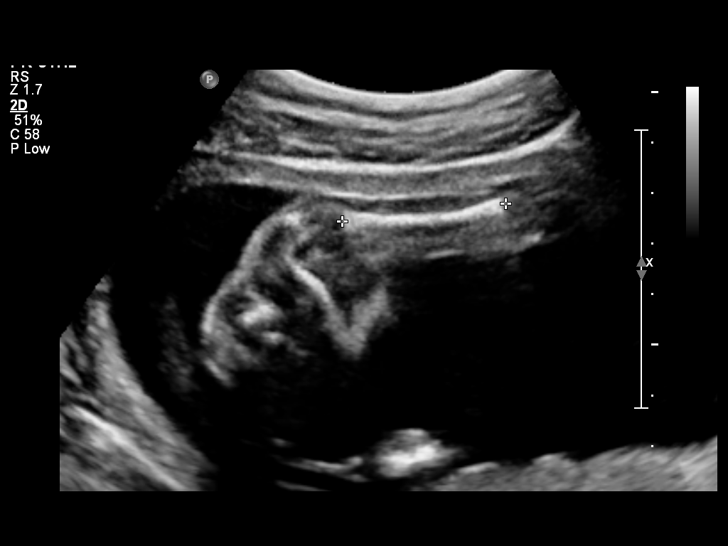
[im 22/118]
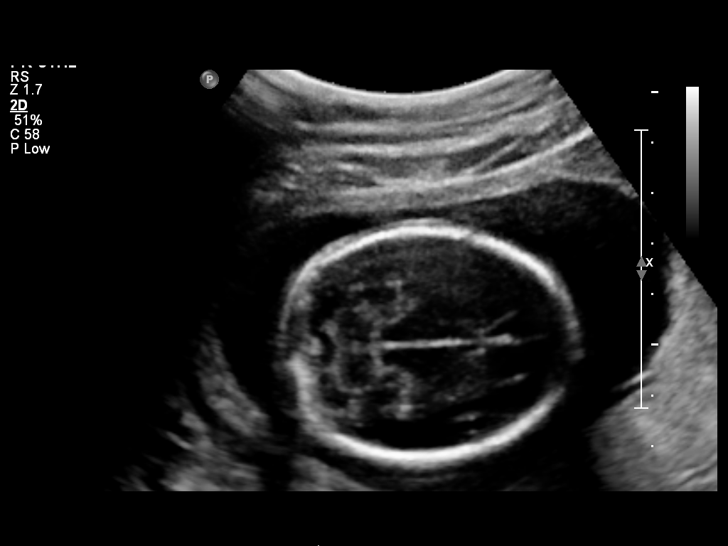
[im 35/118]
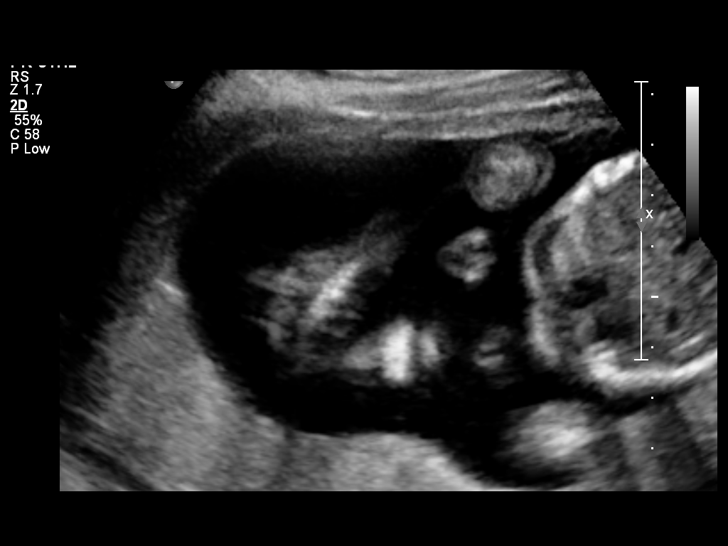
[im 44/118]
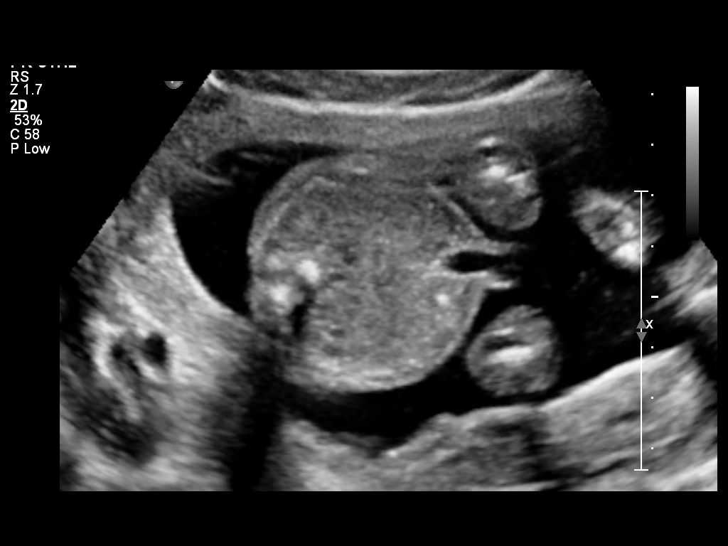
[im 53/118]
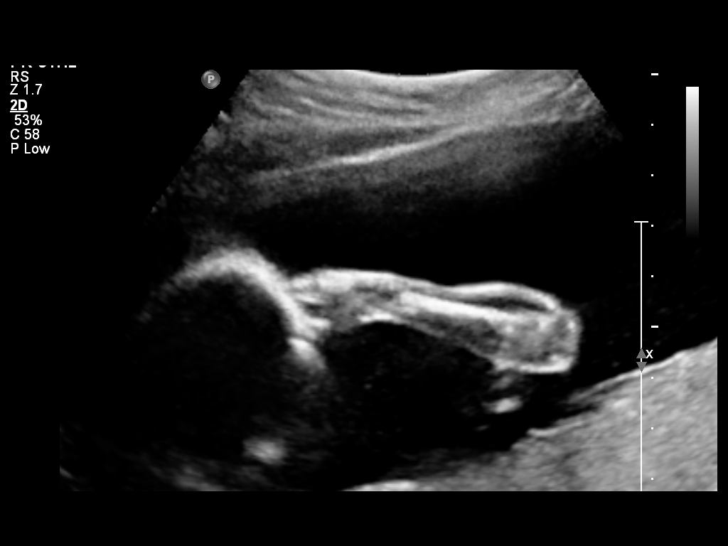
[im 66/118]
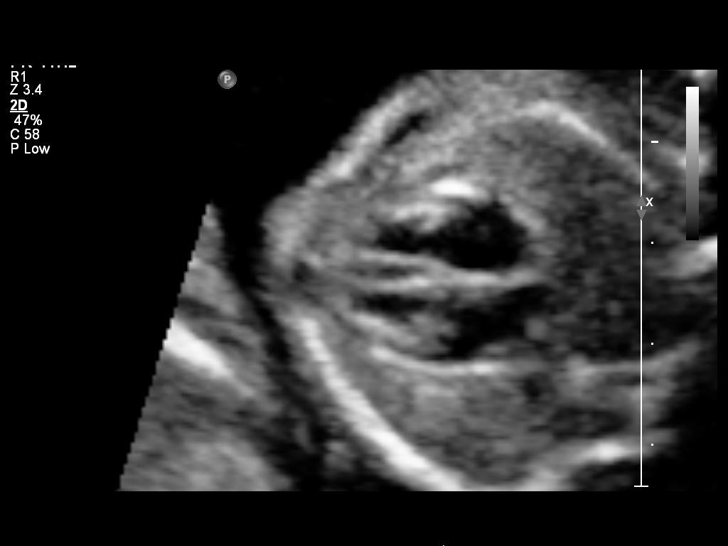
[im 74/118]
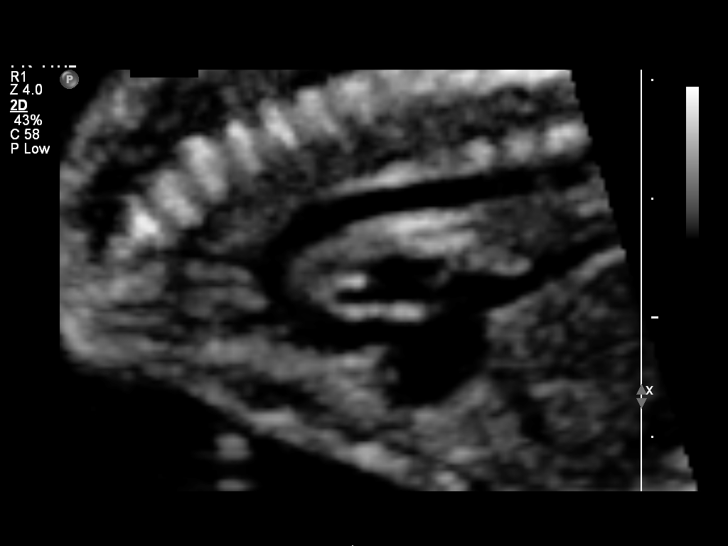
[im 83/118]
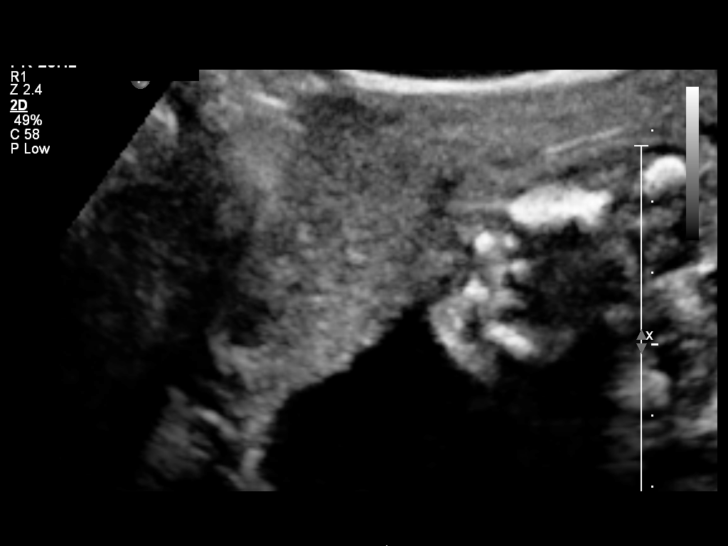
[im 96/118]
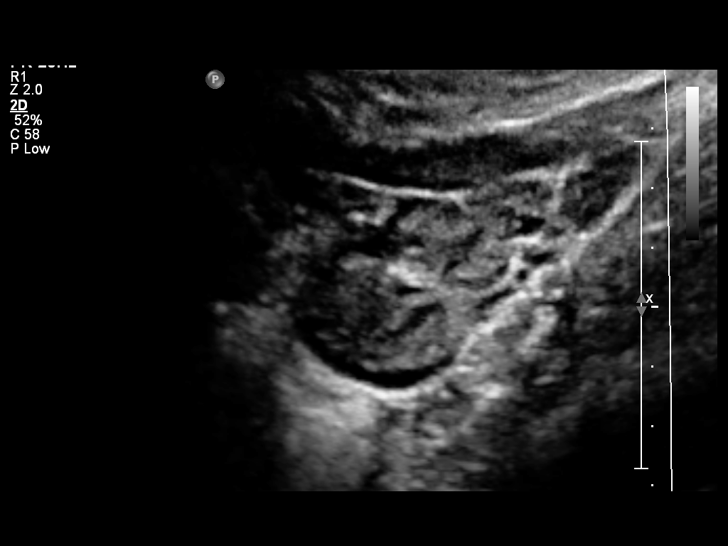
[im 105/118]
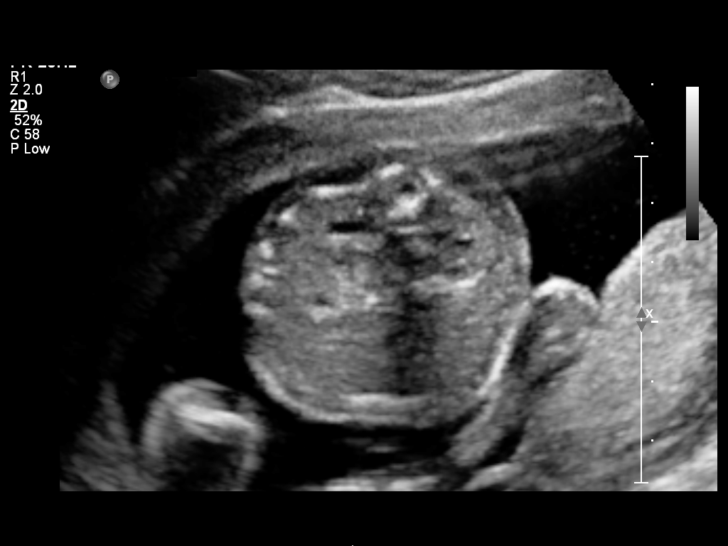
[im 113/118]
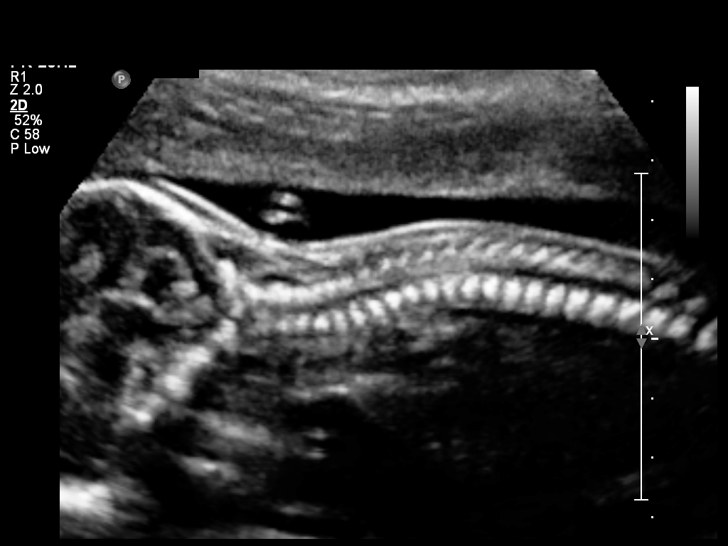

[12 of 28 positions shown; findings below may reference images not displayed]

OBSTETRICS REPORT
                      (Signed Final 07/20/2014 [DATE])

Service(s) Provided

 US OB COMP + 14 WK                                    76805.1
Indications

 Basic anatomic survey
Fetal Evaluation

 Num Of Fetuses:    1
 Fetal Heart Rate:  149                          bpm
 Cardiac Activity:  Observed
 Presentation:      Breech
 Placenta:          Posterior left, above
                    cervical os
 P. Cord            Visualized, central
 Insertion:

 Amniotic Fluid
 AFI FV:      Subjectively within normal limits
                                             Larg Pckt:     4.6  cm
Biometry

 BPD:       46  mm     G. Age:  19w 6d                CI:        73.56   70 - 86
                                                      FL/HC:      19.4   16.8 -

 HC:     170.4  mm     G. Age:  19w 4d       21  %    HC/AC:      1.13   1.09 -

 AC:     150.4  mm     G. Age:  20w 2d       49  %    FL/BPD:
 FL:      33.1  mm     G. Age:  20w 2d       50  %    FL/AC:      22.0   20 - 24
 HUM:     32.3  mm     G. Age:  20w 6d       71  %
 CER:     20.9  mm     G. Age:  19w 6d       45  %
 NFT:     4.63  mm

 Est. FW:     341  gm    0 lb 12 oz      51  %
Gestational Age

 LMP:           20w 1d        Date:  03/01/14                 EDD:   12/06/14
 U/S Today:     20w 0d                                        EDD:   12/07/14
 Best:          20w 1d     Det. By:  LMP  (03/01/14)          EDD:   12/06/14
Anatomy
 Cranium:          Appears normal         Aortic Arch:      Appears normal
 Fetal Cavum:      Appears normal         Ductal Arch:      Appears normal
 Ventricles:       Appears normal         Diaphragm:        Appears normal
 Choroid Plexus:   Appears normal         Stomach:          Appears normal, left
                                                            sided
 Cerebellum:       Appears normal         Abdomen:          Appears normal
 Posterior Fossa:  Appears normal         Abdominal Wall:   Appears nml (cord
                                                            insert, abd wall)
 Nuchal Fold:      Appears normal         Cord Vessels:     Appears normal (3
                                                            vessel cord)
 Face:             Appears normal         Kidneys:          Appear normal
                   (orbits and profile)
 Lips:             Appears normal         Bladder:          Appears normal
 Heart:            Appears normal         Spine:            Appears normal
                   (4CH, axis, and
                   situs)
 RVOT:             Appears normal         Lower             Appears normal
                                          Extremities:
 LVOT:             Appears normal         Upper             Appears normal
                                          Extremities:

 Other:  Nasal bone visualized. Heels visualized. Male gender.
Targeted Anatomy

 Fetal Central Nervous System
 Lat. Ventricles:  6.2                    Cisterna Magna:
Cervix Uterus Adnexa

 Cervical Length:    3.2      cm

 Cervix:       Normal appearance by transabdominal scan.
 Left Ovary:    Not visualized.
 Right Ovary:   Within normal limits.
Comments

 The patient's fetal anatomic survey is now complete.  No fetal
 anomalies or soft markers of aneuploidy were seen.
Impression

 Single living intrauterine pregnancy at 20 weeks 1 day.
 Appropriate fetal growth (51%).
 Normal amniotic fluid volume.
 Normal fetal anatomy.
 No fetal anomalies or soft markers of aneuploidy seen.
Recommendations

 Follow-up ultrasounds as clinically indicated.

                Ceejay, Paulus N
